# Patient Record
Sex: Female | Born: 1985 | Race: White | Hispanic: No | Marital: Single | Smoking: Never smoker
Health system: Southern US, Community
[De-identification: ages and names within clinical notes are randomized; demographics above are authoritative.]

## PROBLEM LIST (undated history)

## (undated) DIAGNOSIS — G43909 Migraine, unspecified, not intractable, without status migrainosus: Secondary | ICD-10-CM

## (undated) DIAGNOSIS — I10 Essential (primary) hypertension: Secondary | ICD-10-CM

## (undated) DIAGNOSIS — N200 Calculus of kidney: Secondary | ICD-10-CM

## (undated) DIAGNOSIS — F419 Anxiety disorder, unspecified: Secondary | ICD-10-CM

## (undated) HISTORY — PX: ORIF TIBIA & FIBULA FRACTURES: SHX2131

---

## 2021-01-06 ENCOUNTER — Other Ambulatory Visit: Payer: Self-pay | Admitting: Orthopedic Surgery

## 2021-01-06 DIAGNOSIS — M4712 Other spondylosis with myelopathy, cervical region: Secondary | ICD-10-CM

## 2021-01-21 ENCOUNTER — Other Ambulatory Visit: Payer: Self-pay

## 2021-01-21 ENCOUNTER — Ambulatory Visit
Admission: RE | Admit: 2021-01-21 | Discharge: 2021-01-21 | Disposition: A | Discharge status: Home / Self Care | Payer: BC Managed Care – PPO | Source: Ambulatory Visit | Attending: Orthopedic Surgery | Admitting: Orthopedic Surgery

## 2021-01-21 DIAGNOSIS — M4712 Other spondylosis with myelopathy, cervical region: Secondary | ICD-10-CM

## 2021-01-21 IMAGING — MR MR CERVICAL SPINE W/O CM
4 of 5 series · 28 of 48 positions shown · non-contrast
Comparison: None.

CLINICAL DATA: Chronic neck and right shoulder pain with right arm
weakness. No prior surgery.

EXAM:
MRI CERVICAL SPINE WITHOUT CONTRAST
TECHNIQUE: Multiplanar, multisequence MR imaging of the cervical spine was
performed. No intravenous contrast was administered.

[Series 3: T2 · sagittal · 3.0mm · 0.66mm/px · 6 of 15 slices shown (1 of 2)]
[im 1/15]
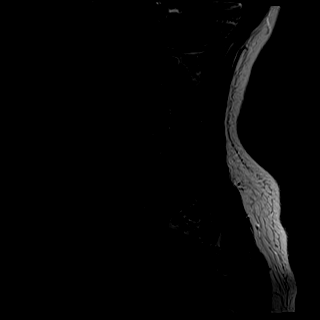
[im 3/15]
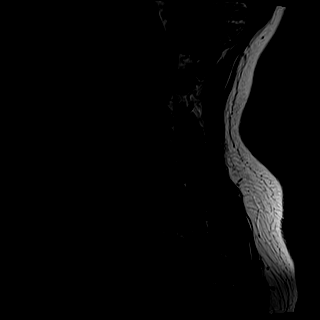
[im 6/15]
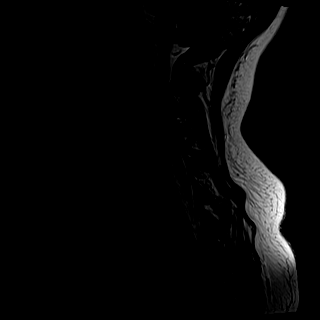
[im 9/15]
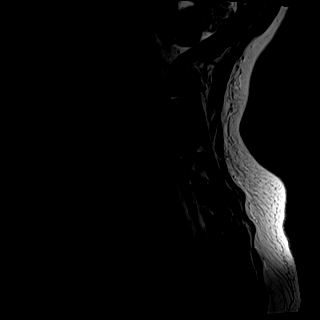
[im 12/15]
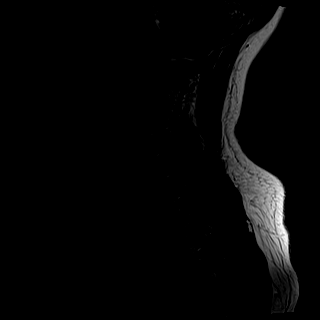
[im 15/15]
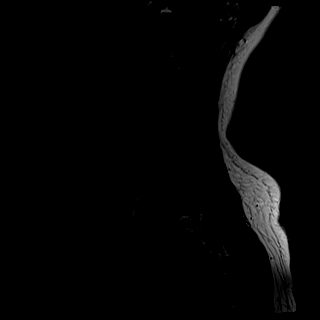

[Series 4: STIR · sagittal · 3.0mm · 0.41mm/px · 7 of 15 slices shown]
[im 1/15]
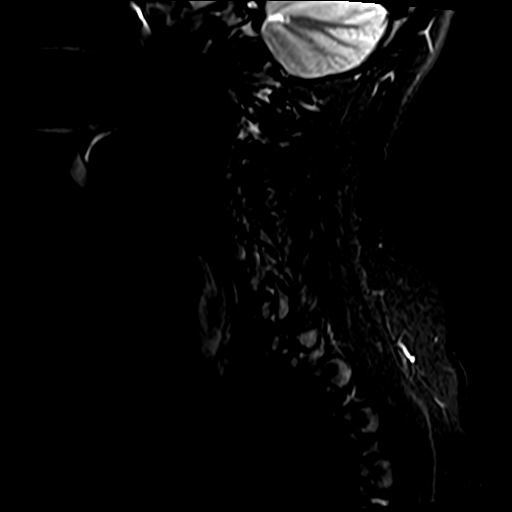
[im 3/15]
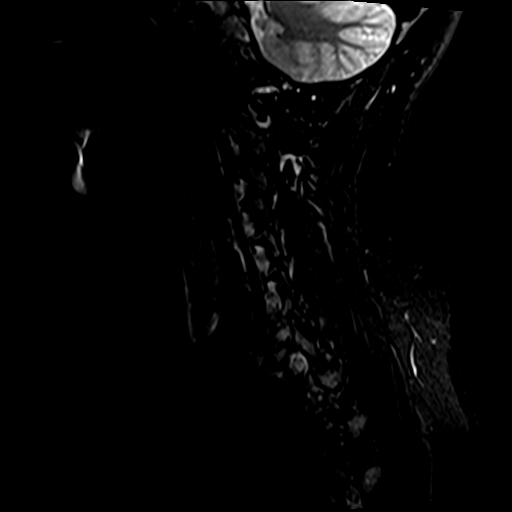
[im 5/15]
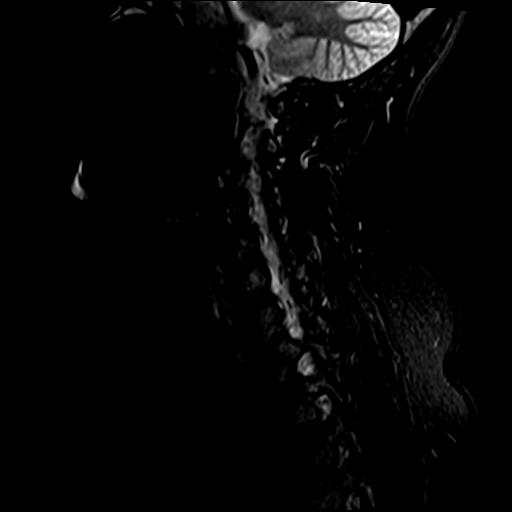
[im 8/15]
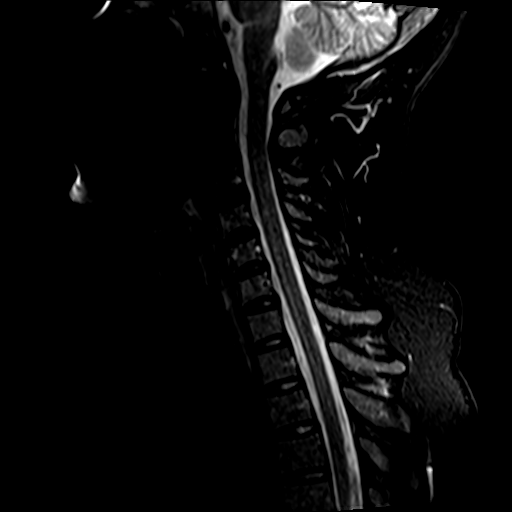
[im 10/15]
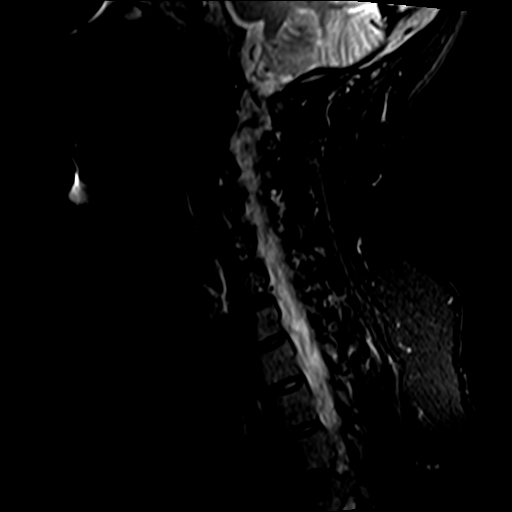
[im 12/15]
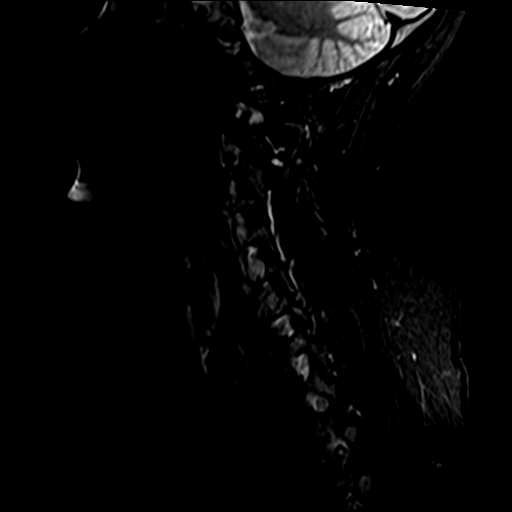
[im 15/15]
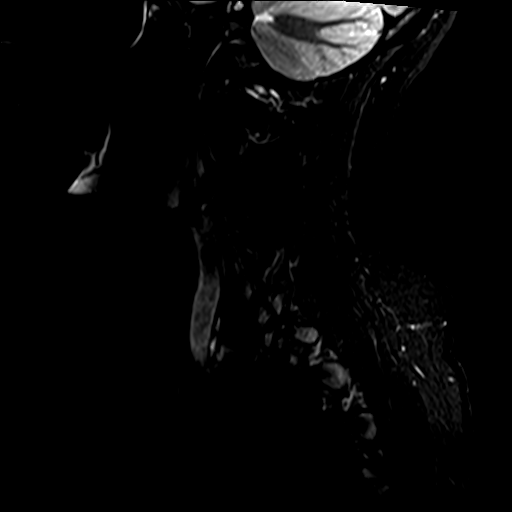

[Series 5: T1 · sagittal · 3.0mm · 0.41mm/px · 7 of 15 slices shown]
[im 1/15]
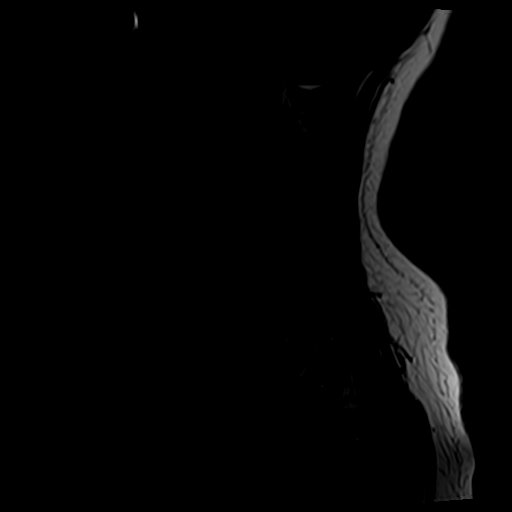
[im 3/15]
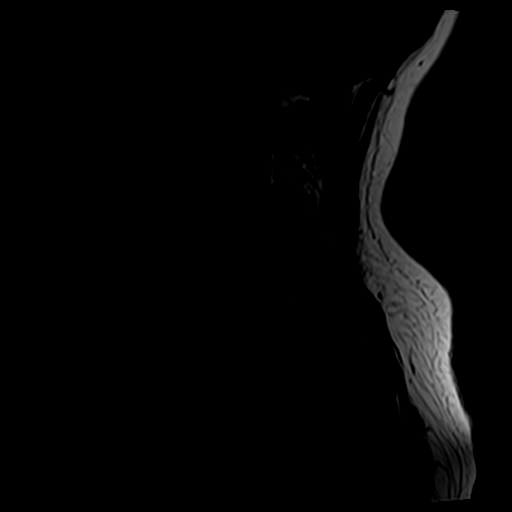
[im 5/15]
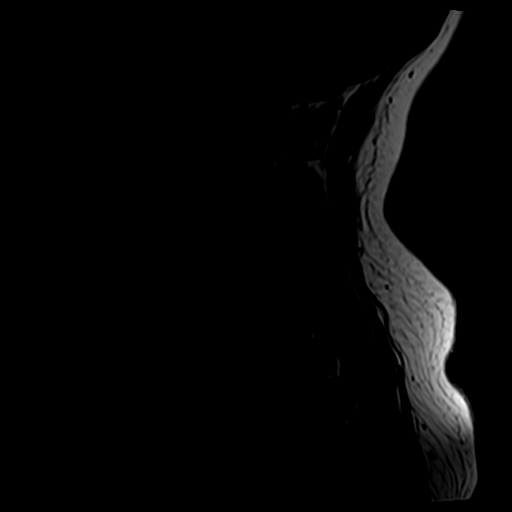
[im 8/15]
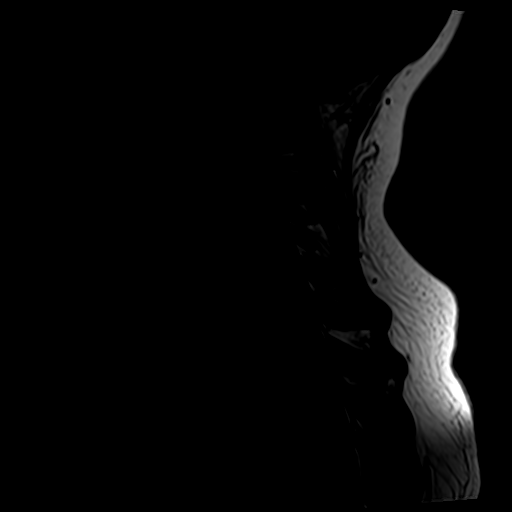
[im 10/15]
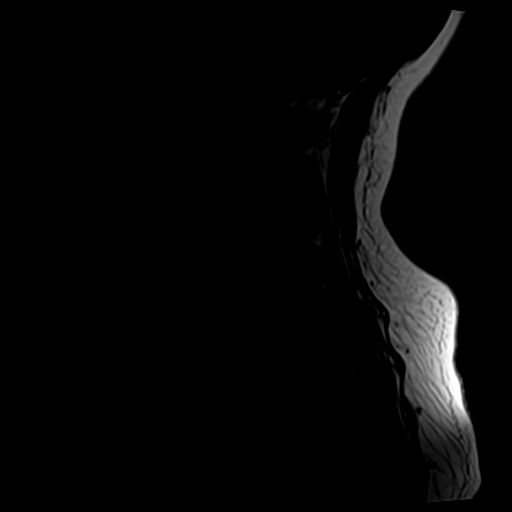
[im 12/15]
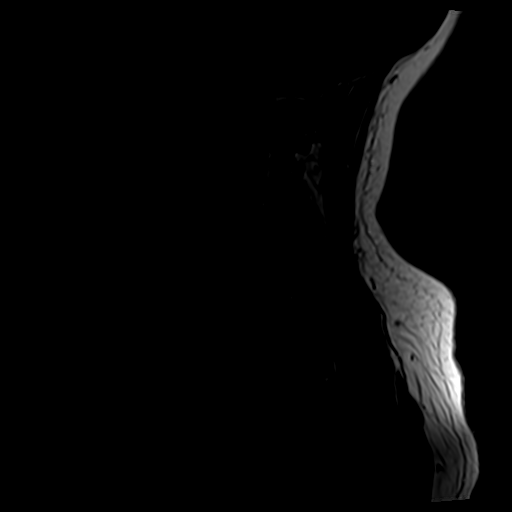
[im 15/15]
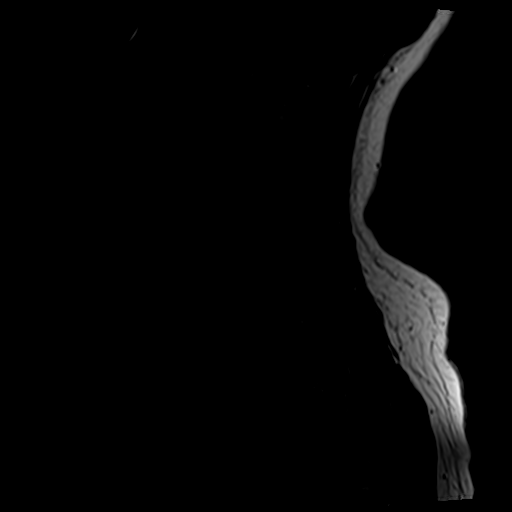

[Series 7: T2 · axial · 3.0mm · 0.70mm/px · z∈[-79,+21]mm · 8 of 30 slices shown (2 of 2)]
[im 1/30]
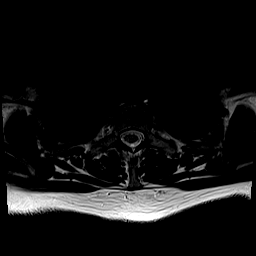
[im 5/30]
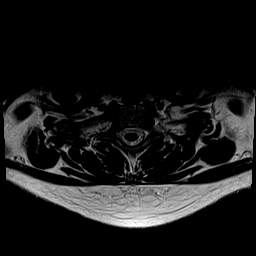
[im 9/30]
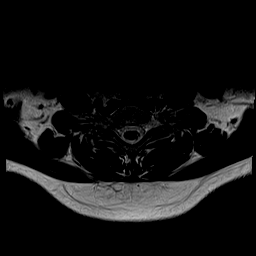
[im 14/30]
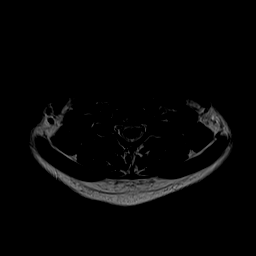
[im 16/30]
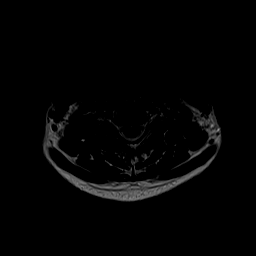
[im 21/30]
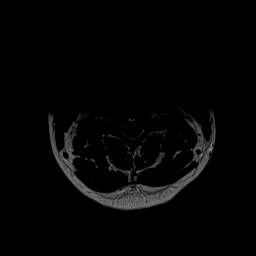
[im 25/30]
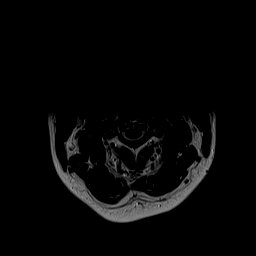
[im 30/30]
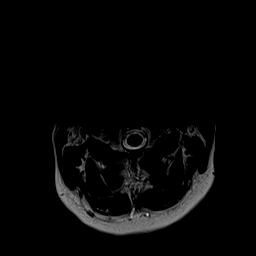

[28 of 48 positions shown; findings below may reference images not displayed]

FINDINGS: Alignment: Straightening of the normal cervical lordosis. No
listhesis.

Vertebrae: No fracture, evidence of discitis, or bone lesion.

Cord: Normal signal and morphology.

Posterior Fossa, vertebral arteries, paraspinal tissues: Negative.
Dominant right vertebral artery.

Disc levels:

Minimal disc bulging from C3-C4 through C6-C7. No stenosis or
impingement at any level.

The visualized upper thoracic spine is unremarkable.
IMPRESSION: 1. Minimal cervical spondylosis without stenosis or impingement at
any level.

## 2022-01-08 ENCOUNTER — Emergency Department (HOSPITAL_BASED_OUTPATIENT_CLINIC_OR_DEPARTMENT_OTHER): Payer: BC Managed Care – PPO

## 2022-01-08 ENCOUNTER — Encounter (HOSPITAL_BASED_OUTPATIENT_CLINIC_OR_DEPARTMENT_OTHER): Payer: Self-pay | Admitting: *Deleted

## 2022-01-08 ENCOUNTER — Other Ambulatory Visit: Payer: Self-pay

## 2022-01-08 ENCOUNTER — Emergency Department (HOSPITAL_BASED_OUTPATIENT_CLINIC_OR_DEPARTMENT_OTHER)
Admission: EM | Admit: 2022-01-08 | Discharge: 2022-01-08 | Disposition: A | Discharge status: Home / Self Care | Payer: BC Managed Care – PPO | Attending: Emergency Medicine | Admitting: Emergency Medicine

## 2022-01-08 DIAGNOSIS — R000 Tachycardia, unspecified: Secondary | ICD-10-CM | POA: Diagnosis not present

## 2022-01-08 DIAGNOSIS — R0789 Other chest pain: Secondary | ICD-10-CM | POA: Diagnosis not present

## 2022-01-08 DIAGNOSIS — R42 Dizziness and giddiness: Secondary | ICD-10-CM | POA: Insufficient documentation

## 2022-01-08 DIAGNOSIS — R0602 Shortness of breath: Secondary | ICD-10-CM | POA: Insufficient documentation

## 2022-01-08 DIAGNOSIS — R002 Palpitations: Secondary | ICD-10-CM | POA: Diagnosis not present

## 2022-01-08 HISTORY — DX: Essential (primary) hypertension: I10

## 2022-01-08 HISTORY — DX: Anxiety disorder, unspecified: F41.9

## 2022-01-08 HISTORY — DX: Migraine, unspecified, not intractable, without status migrainosus: G43.909

## 2022-01-08 HISTORY — DX: Calculus of kidney: N20.0

## 2022-01-08 LAB — CBC WITH DIFFERENTIAL/PLATELET
Abs Immature Granulocytes: 0.01 10*3/uL (ref 0.00–0.07)
Basophils Absolute: 0 10*3/uL (ref 0.0–0.1)
Basophils Relative: 1 %
Eosinophils Absolute: 0 10*3/uL (ref 0.0–0.5)
Eosinophils Relative: 1 %
HCT: 37.3 % (ref 36.0–46.0)
Hemoglobin: 12.7 g/dL (ref 12.0–15.0)
Immature Granulocytes: 0 %
Lymphocytes Relative: 29 %
Lymphs Abs: 1.7 10*3/uL (ref 0.7–4.0)
MCH: 32.9 pg (ref 26.0–34.0)
MCHC: 34 g/dL (ref 30.0–36.0)
MCV: 96.6 fL (ref 80.0–100.0)
Monocytes Absolute: 0.6 10*3/uL (ref 0.1–1.0)
Monocytes Relative: 11 %
Neutro Abs: 3.3 10*3/uL (ref 1.7–7.7)
Neutrophils Relative %: 58 %
Platelets: 274 10*3/uL (ref 150–400)
RBC: 3.86 MIL/uL — ABNORMAL LOW (ref 3.87–5.11)
RDW: 12 % (ref 11.5–15.5)
WBC: 5.6 10*3/uL (ref 4.0–10.5)
nRBC: 0 % (ref 0.0–0.2)

## 2022-01-08 LAB — BASIC METABOLIC PANEL
Anion gap: 7 (ref 5–15)
BUN: 13 mg/dL (ref 6–20)
CO2: 20 mmol/L — ABNORMAL LOW (ref 22–32)
Calcium: 8.6 mg/dL — ABNORMAL LOW (ref 8.9–10.3)
Chloride: 108 mmol/L (ref 98–111)
Creatinine, Ser: 0.93 mg/dL (ref 0.44–1.00)
GFR, Estimated: >60 mL/min (ref >60)
Glucose, Bld: 103 mg/dL — ABNORMAL HIGH (ref 70–99)
Potassium: 3.6 mmol/L (ref 3.5–5.1)
Sodium: 135 mmol/L (ref 135–145)

## 2022-01-08 LAB — RAPID URINE DRUG SCREEN, HOSP PERFORMED
Amphetamines: NOT DETECTED
Barbiturates: NOT DETECTED
Benzodiazepines: NOT DETECTED
Cocaine: NOT DETECTED
Opiates: NOT DETECTED
Tetrahydrocannabinol: POSITIVE — AB

## 2022-01-08 LAB — HCG, SERUM, QUALITATIVE: Preg, Serum: NEGATIVE

## 2022-01-08 LAB — TROPONIN I (HIGH SENSITIVITY): Troponin I (High Sensitivity): 2 ng/L (ref <18)

## 2022-01-08 IMAGING — CR DG CHEST 2V
2 series · 2 of 2 positions shown · non-contrast
Comparison: None.

CLINICAL DATA: Dyspnea

EXAM:
CHEST - 2 VIEW

[w chest pa]
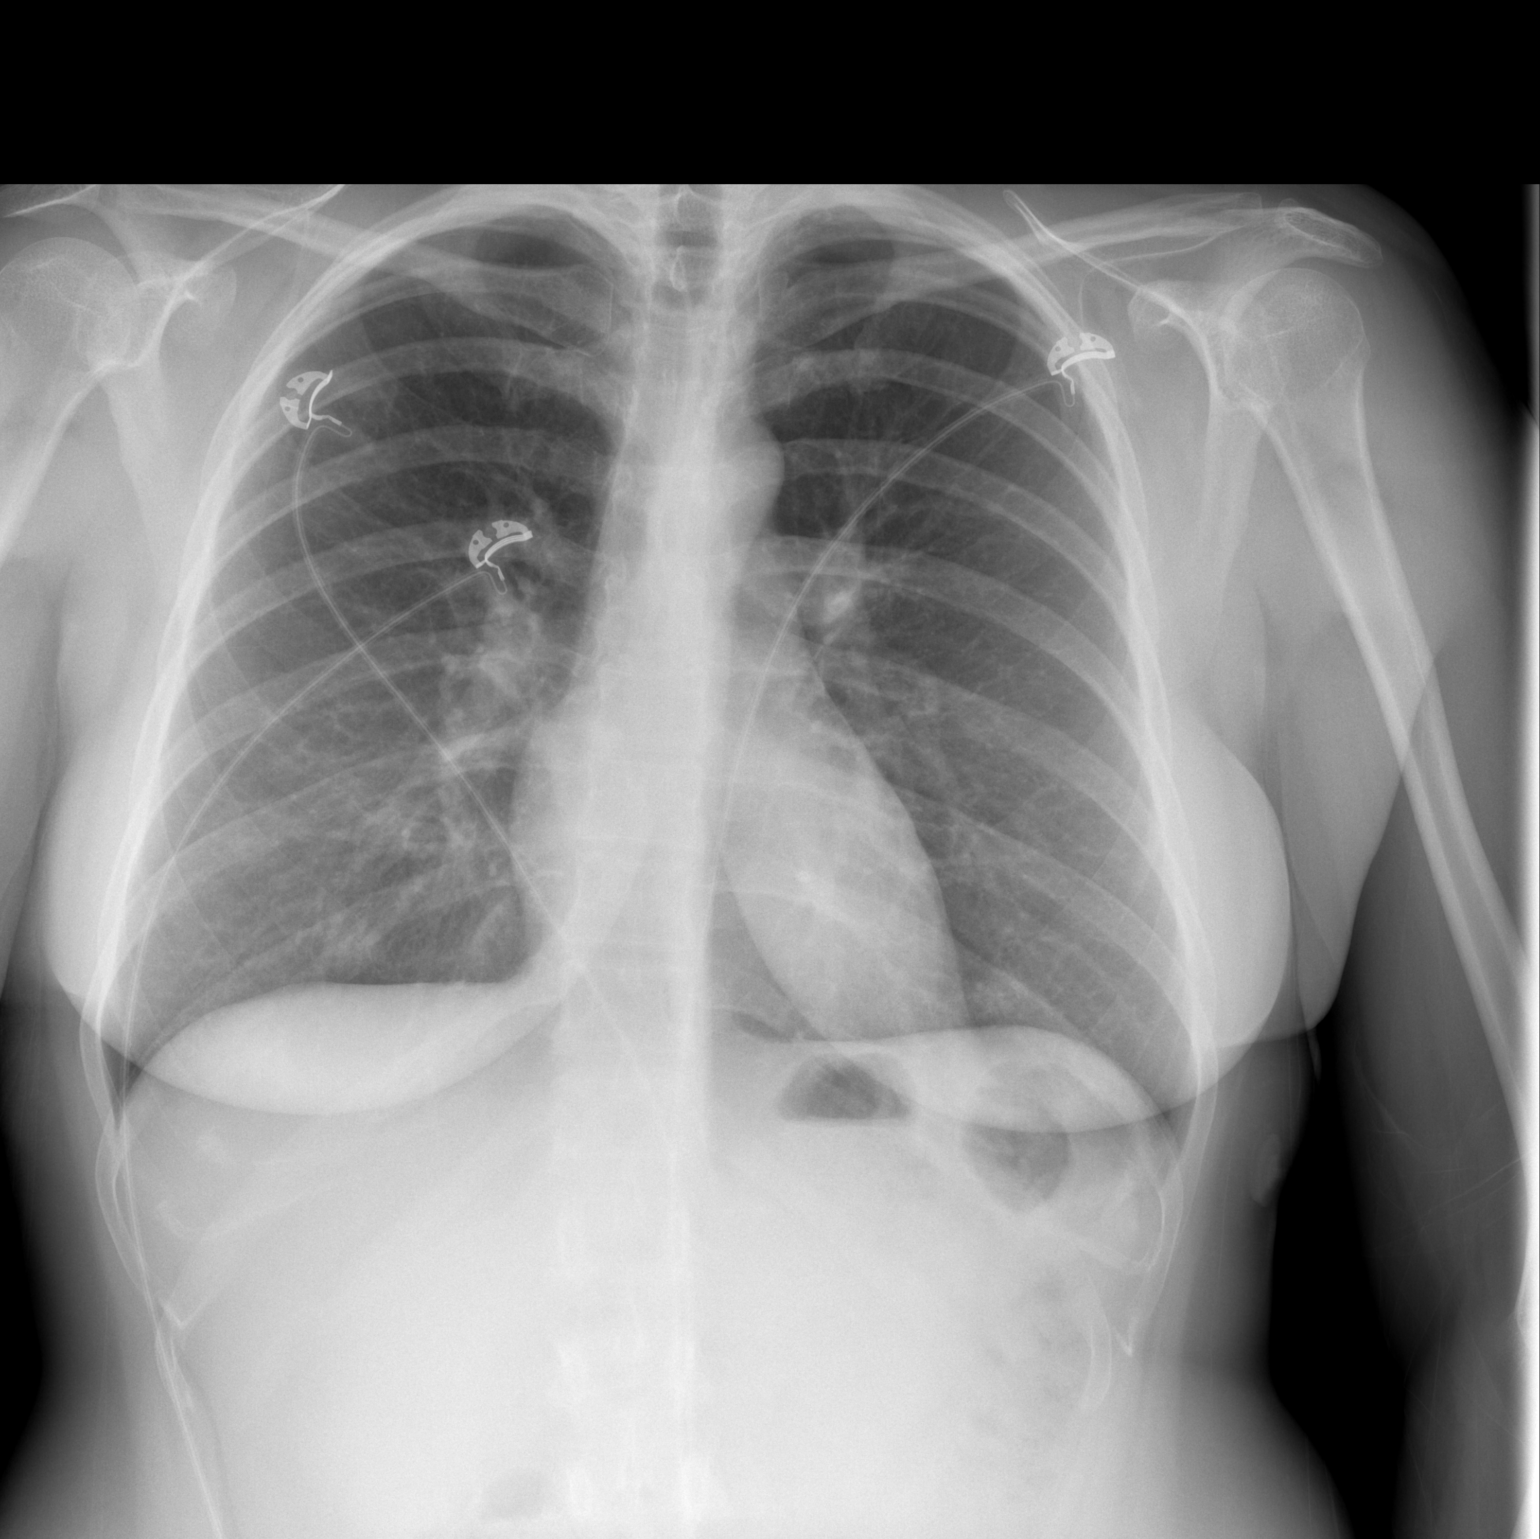

[w chest lat]
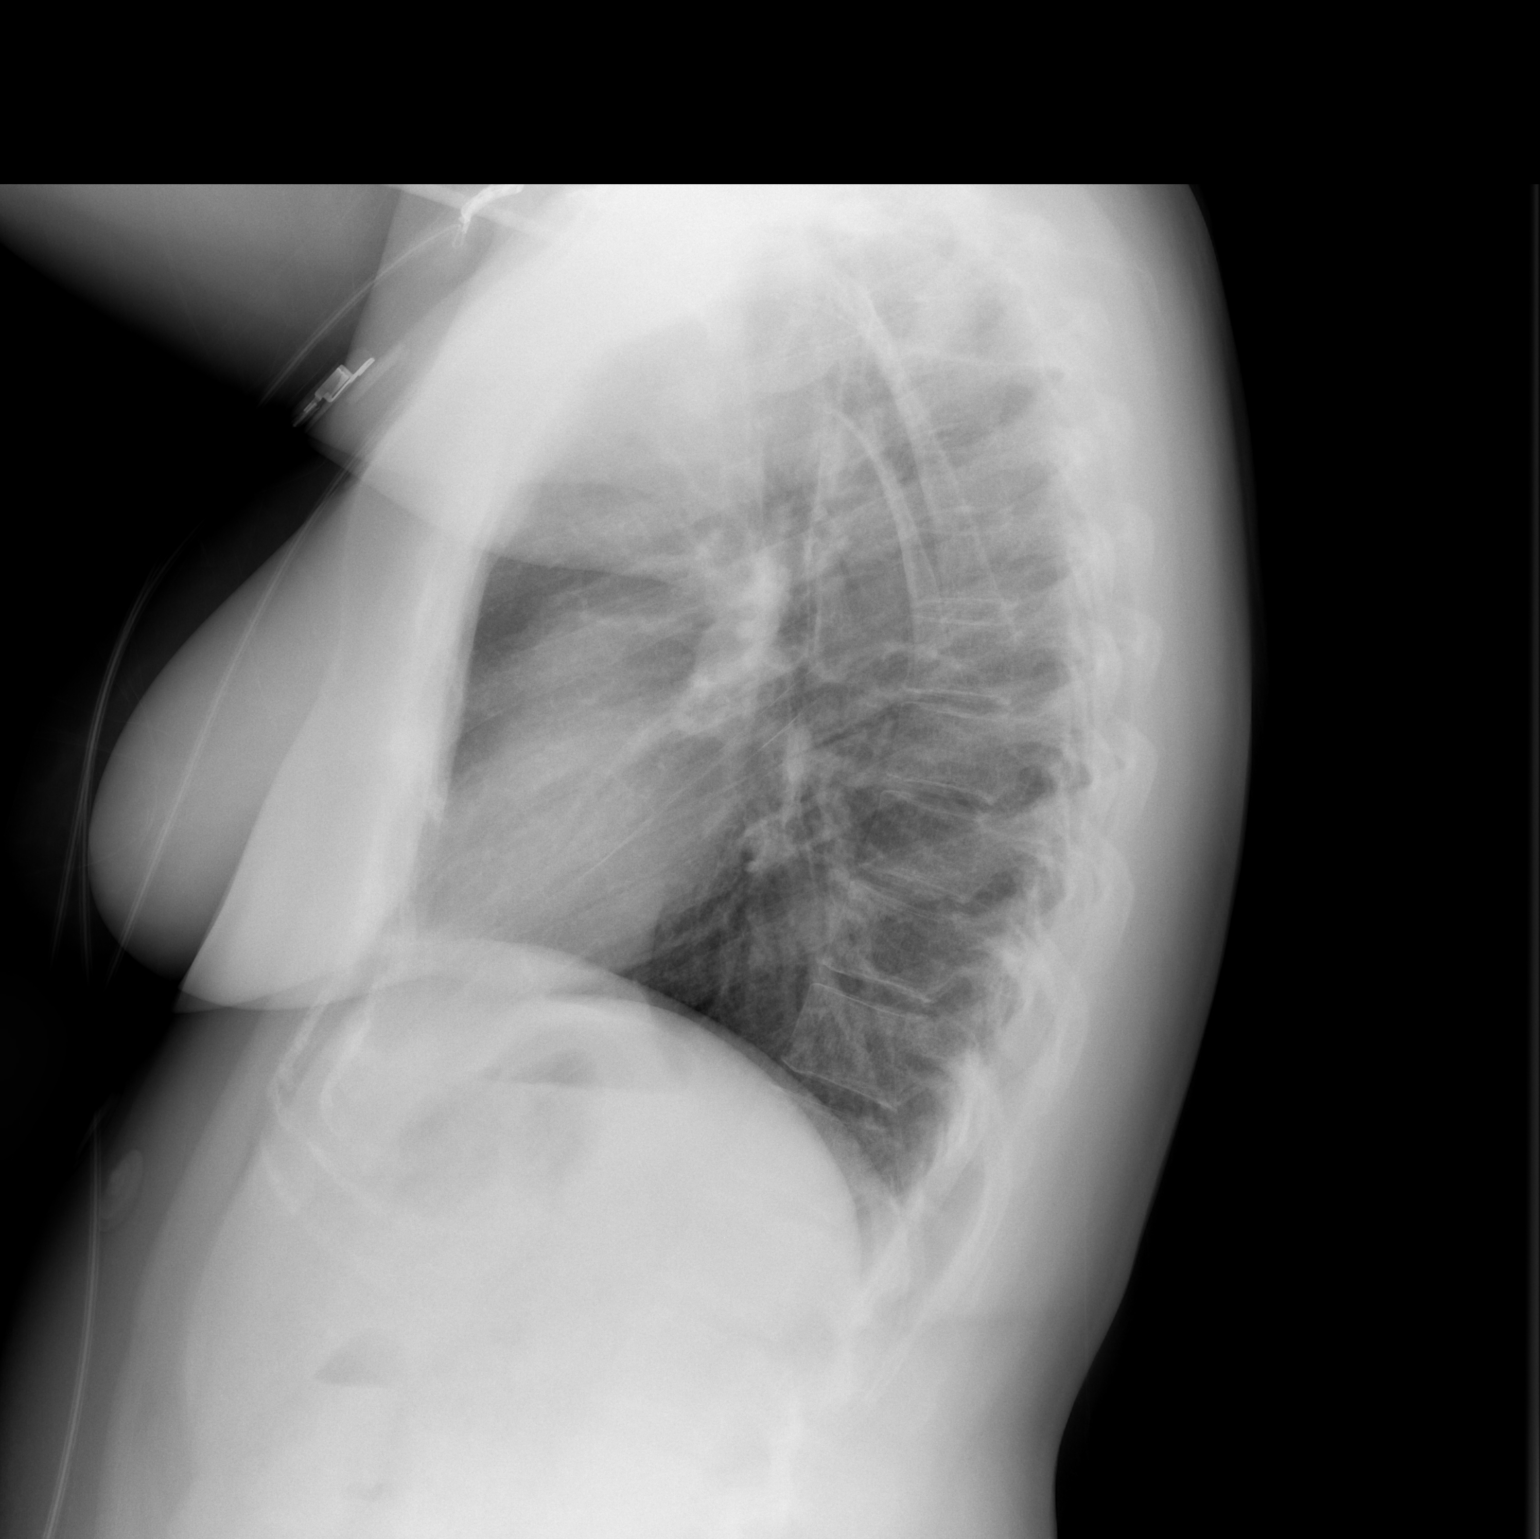

[2 of 2 positions shown; findings below may reference images not displayed]

FINDINGS: The heart size and mediastinal contours are within normal limits.
Both lungs are clear. The visualized skeletal structures are
unremarkable.
IMPRESSION: No active cardiopulmonary disease.

## 2022-01-08 NOTE — ED Provider Notes (Signed)
[facility] EMERGENCY DEPARTMENT Provider Note   CSN: [id number] Arrival date & time: 01/08/22  1815     History  Chief Complaint  Patient presents with   Palpitations    Michele Ray is a 36 y.o. female.  With past medical history of hypertension who presents to the emergency department with palpitations.  Patient states that last night she consumed a delta 8 gummy.  She states that she also had 1 whiskey drink with dinner.  She states that she was fine last night however woke up this morning feeling like her heart was "racing and dropping" around 8 AM.  She states that this is persisted throughout the day until presentation here to the emergency department.  She additionally endorses chest pain, lightheadedness, dizziness and chest tightness.  She denies any coughing, nausea, vomiting, diaphoresis.  She denies any lower extremity swelling, recent immobilization or travel.  States a few months ago she ate a delta 8 gummy and had palpitations. States symptoms did not last this long last time.    Palpitations Associated symptoms: shortness of breath   Associated symptoms: no chest pain, no cough, no dizziness, no nausea and no vomiting       Home Medications Prior to Admission medications   Not on File      Allergies    Patient has no known allergies.    Review of Systems   Review of Systems  Constitutional:  Negative for fever.  Respiratory:  Positive for chest tightness and shortness of breath. Negative for cough.   Cardiovascular:  Positive for palpitations. Negative for chest pain and leg swelling.  Gastrointestinal:  Negative for nausea and vomiting.  Neurological:  Positive for light-headedness. Negative for dizziness and syncope.  All other systems reviewed and are negative.  Physical Exam Updated Vital Signs BP 119/85 (BP Location: Right Arm)    Pulse (!) 122    Resp 18    Wt 77.1 kg    LMP  (LMP Unknown)    SpO2 100%  Physical Exam Vitals and  nursing note reviewed.  Constitutional:      General: She is not in acute distress.    Appearance: Normal appearance. She is normal weight. She is not ill-appearing or toxic-appearing.  HENT:     Head: Normocephalic and atraumatic.     Nose: Nose normal.     Mouth/Throat:     Mouth: Mucous membranes are moist.     Pharynx: Oropharynx is clear.  Eyes:     General: No scleral icterus.    Extraocular Movements: Extraocular movements intact.     Pupils: Pupils are equal, round, and reactive to light.  Cardiovascular:     Rate and Rhythm: Regular rhythm. Tachycardia present.     Pulses: Normal pulses.     Heart sounds: Normal heart sounds. No murmur heard. Pulmonary:     Effort: Pulmonary effort is normal. No respiratory distress.     Breath sounds: Normal breath sounds.  Abdominal:     Palpations: Abdomen is soft.  Musculoskeletal:        General: Normal range of motion.     Cervical back: Normal range of motion and neck supple.  Skin:    General: Skin is warm and dry.     Capillary Refill: Capillary refill takes less than 2 seconds.     Findings: No rash.  Neurological:     General: No focal deficit present.     Mental Status: She is alert and  oriented to person, place, and time. Mental status is at baseline.  Psychiatric:        Mood and Affect: Mood normal.        Behavior: Behavior normal.        Thought Content: Thought content normal.        Judgment: Judgment normal.    ED Results / Procedures / Treatments   Labs (all labs ordered are listed, but only abnormal results are displayed) Labs Reviewed  BASIC METABOLIC PANEL - Abnormal; Notable for the following components:      Result Value   CO2 20 (*)    Glucose, Bld 103 (*)    Calcium 8.6 (*)    All other components within normal limits  CBC WITH DIFFERENTIAL/PLATELET - Abnormal; Notable for the following components:   RBC 3.86 (*)    All other components within normal limits  RAPID URINE DRUG SCREEN, HOSP  PERFORMED - Abnormal; Notable for the following components:   Tetrahydrocannabinol POSITIVE (*)    All other components within normal limits  HCG, SERUM, QUALITATIVE  TROPONIN I (HIGH SENSITIVITY)  TROPONIN I (HIGH SENSITIVITY)    EKG EKG Interpretation  Date/Time:  Saturday January 08 2022 18:30:42 EST Ventricular Rate:  119 PR Interval:  122 QRS Duration: 94 QT Interval:  340 QTC Calculation: 478 R Axis:   76 Text Interpretation: Sinus tachycardia Otherwise normal ECG No previous ECGs available Confirmed by Alona Bene [phone]) on 01/08/2022 6:39:45 PM  Radiology DG Chest 2 View  Result Date: 01/08/2022 CLINICAL DATA:  Dyspnea EXAM: CHEST - 2 VIEW COMPARISON:  None. FINDINGS: The heart size and mediastinal contours are within normal limits. Both lungs are clear. The visualized skeletal structures are unremarkable. IMPRESSION: No active cardiopulmonary disease. Electronically Signed   By: Helyn Numbers M.D.   On: 01/08/2022 19:40    Procedures Procedures   Medications Ordered in ED Medications - No data to display  ED Course/ Medical Decision Making/ A&P                           Medical Decision Making Amount and/or Complexity of Data Reviewed Labs: ordered. Radiology: ordered.  Patient presents to the ED with complaints of palpitations. This involves an extensive number of treatment options, and is a complaint that carries with it a high risk of complications and morbidity.   Additional history obtained:  Additional history obtained from: n/a  External records from outside source obtained and reviewed including: n/a  EKG: EKG: sinus tachycardia.   Cardiac Monitoring: The patient was maintained on a cardiac monitor.  I personally viewed and interpreted the cardiac monitored which showed an underlying rhythm of: sinus rhythm to sinus tachycardia  Lab Results: I personally ordered, reviewed, and interpreted labs. Pertinent results include: CBC without anemia  BMP  without electrolyte dysfunction  Negative pregnancy Troponin x1 negative  UDS + THC   Imaging Studies ordered:  I ordered imaging studies which included x-ray.  I independently reviewed & interpreted imaging & am in agreement with radiology impression. Imaging shows: No active cardiopulmonary disease   ED Course: 36 year old female who presents to the emergency department with palpitations.  EKG without signs of ischemia or infarction, troponin x1 negative.  Doubt ACS at this time. CBC without anemia to contribute to tachycardia Not pregnant No electrolyte dysfunctions Chest x-ray without evidence of pneumothorax, pneumonia Given that she is not hypertensive, febrile with her doctors already I will not pursue  TSH at this time.  Doubt thyroiditis. No coingestions with THC. EKG with sinus tachycardia without tachyarrhythmia requiring further intervention. Doubt other etiologies of her symptoms such as tamponade, dissection, metabolic derangements. Work-up reassuring.  Unclear etiology of her palpitations although I do think ingestion of THC last night is the likely culprit at this time.  She had similar symptoms with previous ingestion of THC however just not as severe.  Doubt cardiopulmonary process of her symptoms.  Discussed these findings with her at bedside.  D[facility]ed [facility], drug use, alcohol use until symptoms resolve.  Discussed drinking lots of fluids as well as rest.  She verbalized understanding.  I have answered all her questions at this time.  She is otherwise hemodynamically stable.  After consideration of the diagnostic results and the patients response to treatment, I feel that the patent would benefit from discharge. The patient has been appropriately medically screened and/or stabilized in the ED. I have low suspicion for any other emergent medical condition which would require further screening, evaluation or treatment in the ED or require inpatient management.  The patient is overall well appearing and non-toxic in appearance. They are hemodynamically stable at time of discharge.   Final Clinical Impression(s) / ED Diagnoses Final diagnoses:  Palpitations    Rx / DC Orders ED Discharge Orders     None         Cristopher Peruutry, Norita Meigs E, PA-C 01/08/22 2141    Maia PlanLong, Joshua G, MD 01/18/22 [phone]

## 2022-01-08 NOTE — ED Triage Notes (Signed)
Pt states that she has had heart palpitations today as high as 150 (based on apple watch).  Pt reports that she took a delta 8 gummy last pm she is unsure how many mg but thinks it was 60mg .  Pt reports that she once before had heart racing after taking a thc gummy but when the gummy had worn off the palpitations were gone.  Pt is concerned that she continues to have palpitations almost 24 hours after taking the gummy.

## 2022-01-08 NOTE — Discharge Instructions (Addendum)
You were seen in the emergency department to day for palpitations. Your workup was very reassuring. I think your palpitations are likely related to the [facility] use last night. Please abstain from [facility] as well as alcohol until your symptoms resolve. Continue to drink lots of water. Please return for worsening symptoms.

## 2022-01-08 NOTE — ED Notes (Signed)
Pt and sister had questions about discharge and were concerned.  Explained the significance of her troponin which was not elevated and EKG and we discussed her anxiety and the benefit to taking her prn home medication as well as returning here with worsening symptoms

## 2022-02-09 ENCOUNTER — Telehealth: Payer: Self-pay

## 2022-02-09 NOTE — Telephone Encounter (Signed)
NOTES SCANNED TO REFERRAL 

## 2022-03-25 ENCOUNTER — Other Ambulatory Visit: Payer: Self-pay | Admitting: Specialist

## 2022-03-25 DIAGNOSIS — R42 Dizziness and giddiness: Secondary | ICD-10-CM

## 2022-03-25 DIAGNOSIS — H9319 Tinnitus, unspecified ear: Secondary | ICD-10-CM

## 2022-03-31 ENCOUNTER — Ambulatory Visit: Payer: Commercial Managed Care - PPO | Admitting: Cardiology

## 2022-04-14 ENCOUNTER — Ambulatory Visit
Admission: RE | Admit: 2022-04-14 | Discharge: 2022-04-14 | Disposition: A | Discharge status: Home / Self Care | Payer: BC Managed Care – PPO | Source: Ambulatory Visit | Attending: Specialist | Admitting: Specialist

## 2022-04-14 DIAGNOSIS — R42 Dizziness and giddiness: Secondary | ICD-10-CM

## 2022-04-14 DIAGNOSIS — H9319 Tinnitus, unspecified ear: Secondary | ICD-10-CM

## 2022-04-14 IMAGING — MR MR HEAD WO/W CM
10 of 12 series · 31 of 48 positions shown · IV contrast (multihance)
Comparison: None Available.

CLINICAL DATA: Dizziness, tinnitus, unspecified laterality.
Migraines and confusion.

EXAM:
MRI HEAD WITHOUT AND WITH CONTRAST
TECHNIQUE: Multiplanar, multiecho pulse sequences of the brain and surrounding
structures were obtained without and with intravenous contrast.
CONTRAST:  15mL MULTIHANCE GADOBENATE DIMEGLUMINE 529 MG/ML IV SOLN

[Series 2: T1 · sagittal · 5.0mm · 0.45mm/px · 3 of 21 slices shown (1 of 2)]
[im 1/21]
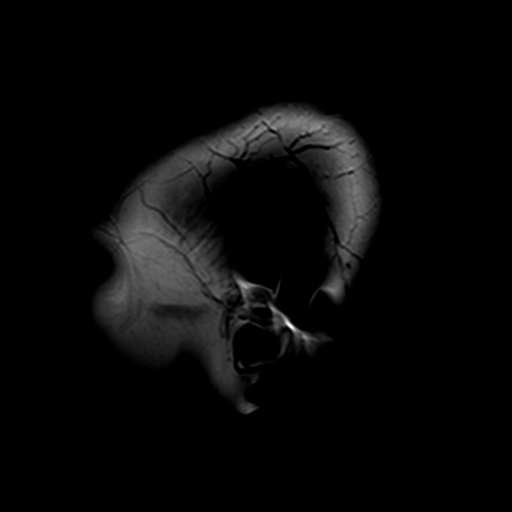
[im 11/21]
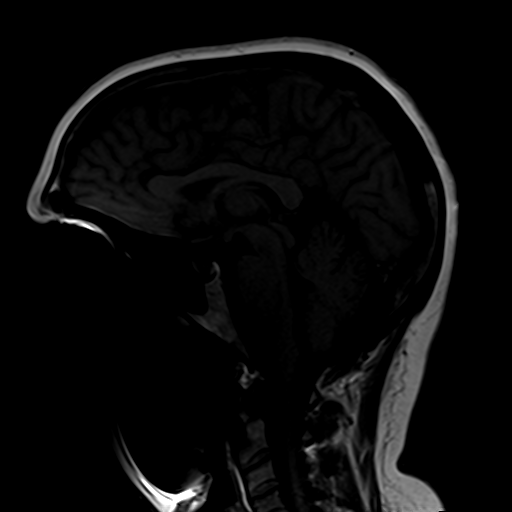
[im 21/21]
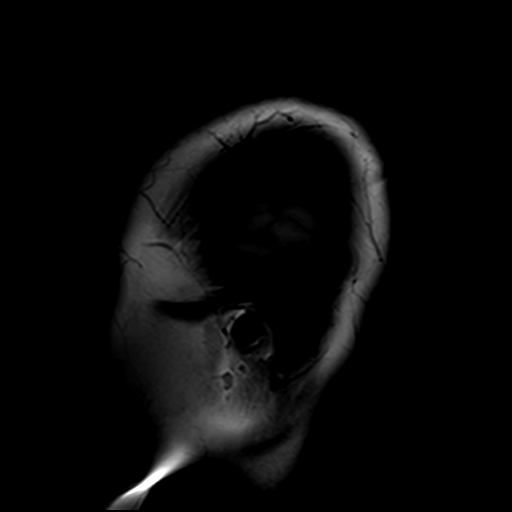

[Series 3: DWI · axial · 5.0mm · 1.80mm/px · z∈[-39,+117]mm · 6 of 49 slices shown (1 of 2)]
[im 1/49]
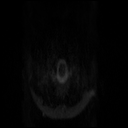
[im 10/49]
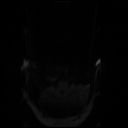
[im 20/49]
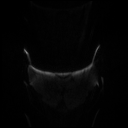
[im 29/49]
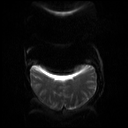
[im 39/49]
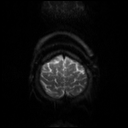
[im 49/49]
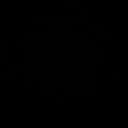

[Series 4: DWI · axial · 5.0mm · 1.80mm/px · z∈[-39,+111]mm · 3 of 24 slices shown (2 of 2)]
[im 1/24]
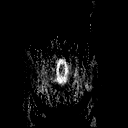
[im 12/24]
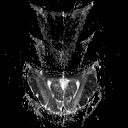
[im 24/24]
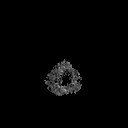

[Series 5: T2 · axial · 5.0mm · 0.51mm/px · z∈[-23,+107]mm · 2 of 21 slices shown (1 of 2)]
[im 1/21]
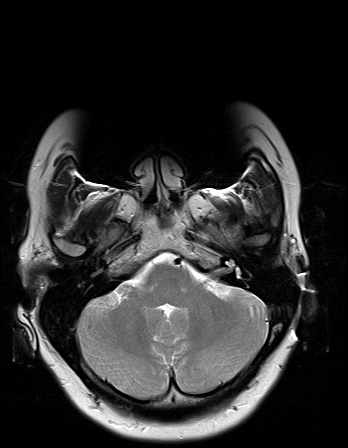
[im 21/21]
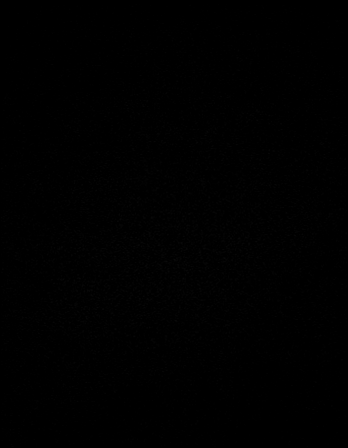

[Series 6: axial grad (blood) · axial · 5.0mm · 0.45mm/px · z∈[-46,+104]mm · 3 of 24 slices shown]
[im 1/24]
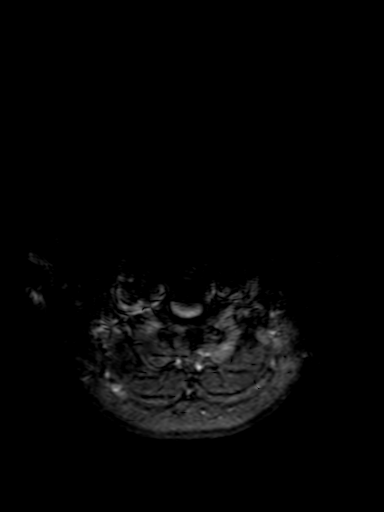
[im 12/24]
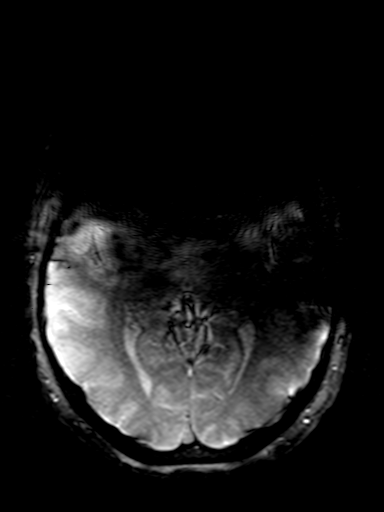
[im 24/24]
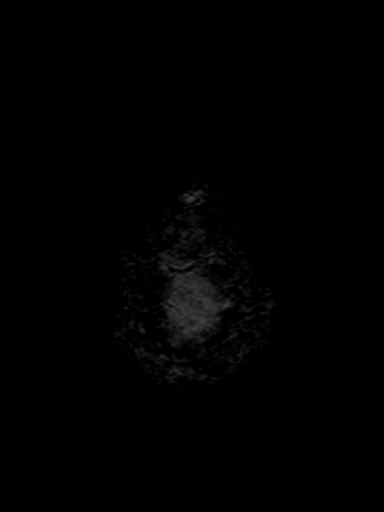

[Series 7: FLAIR · axial · 5.0mm · 0.45mm/px · z∈[-23,+107]mm · 2 of 21 slices shown]
[im 1/21]
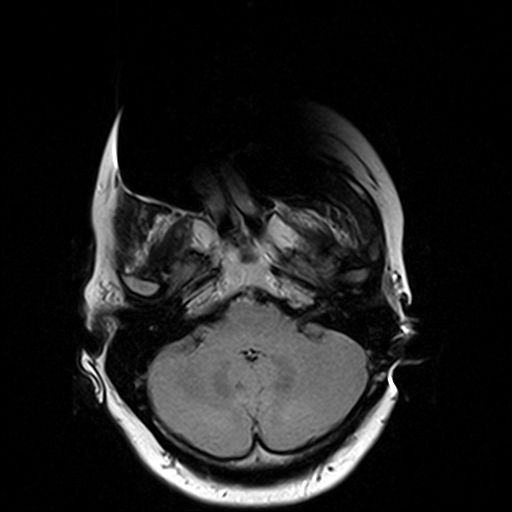
[im 21/21]
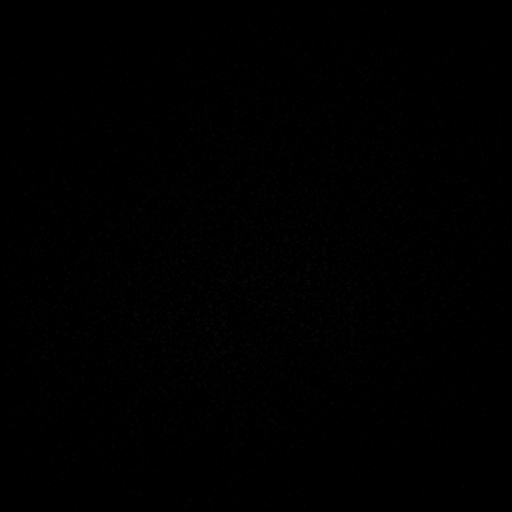

[Series 9: T2 · coronal · 5.0mm · 0.45mm/px · 3 of 26 slices shown (2 of 2)]
[im 1/26]
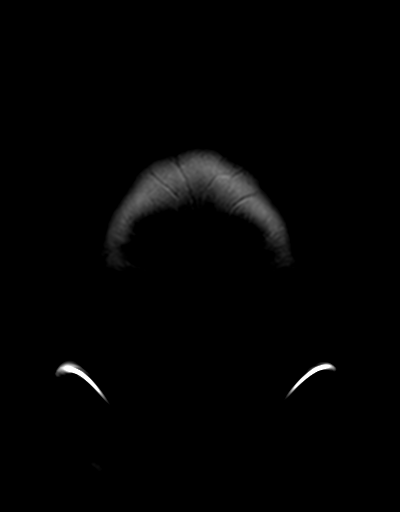
[im 13/26]
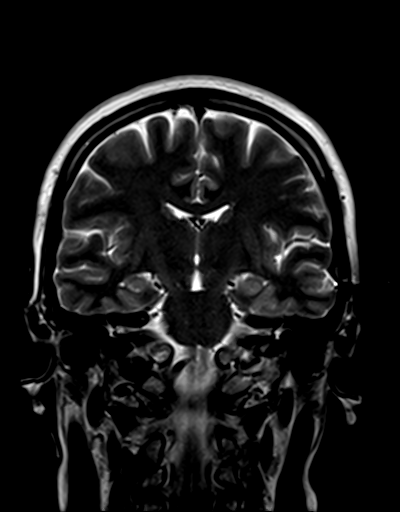
[im 26/26]
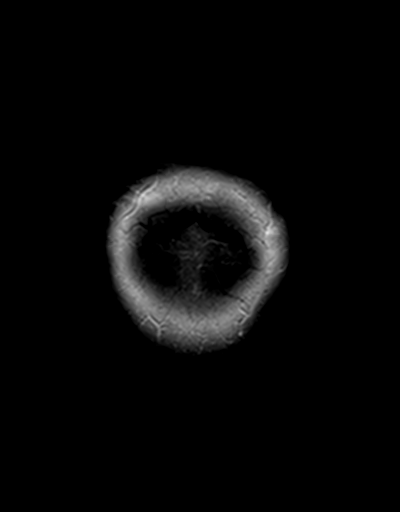

[Series 10: T1 · axial · 5.0mm · 0.45mm/px · z∈[-50,+99]mm · 3 of 24 slices shown (2 of 2)]
[im 1/24]
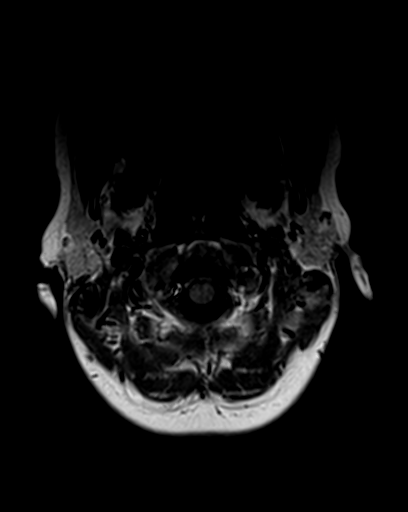
[im 12/24]
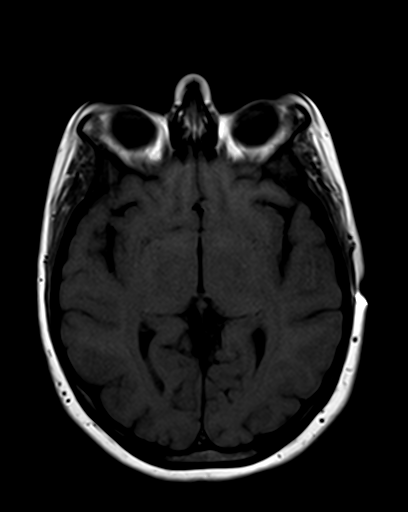
[im 24/24]
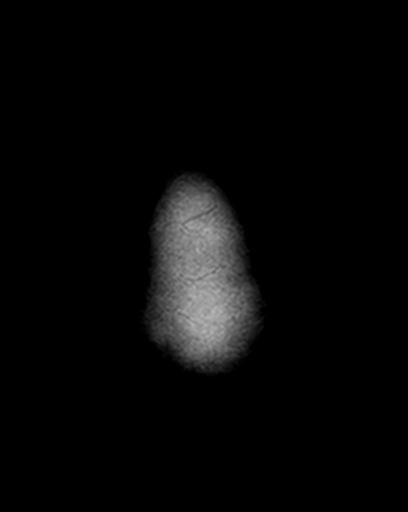

[Series 12: T1 post-contrast · axial · 5.0mm · 0.45mm/px · z∈[-54,+102]mm · 3 of 25 slices shown (1 of 2)]
[im 1/25]
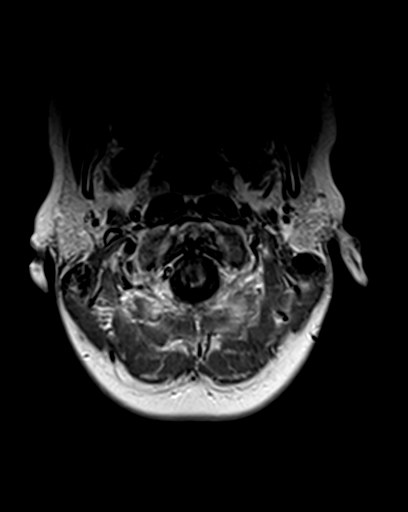
[im 13/25]
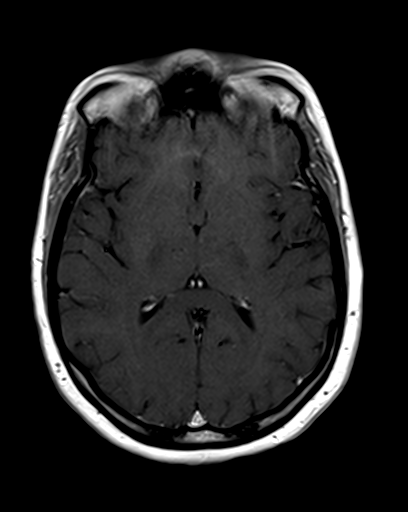
[im 25/25]
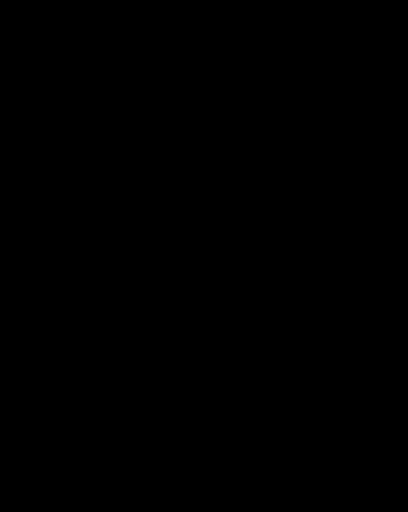

[Series 13: T1 post-contrast · coronal · 5.0mm · 0.45mm/px · 3 of 26 slices shown (2 of 2)]
[im 1/26]
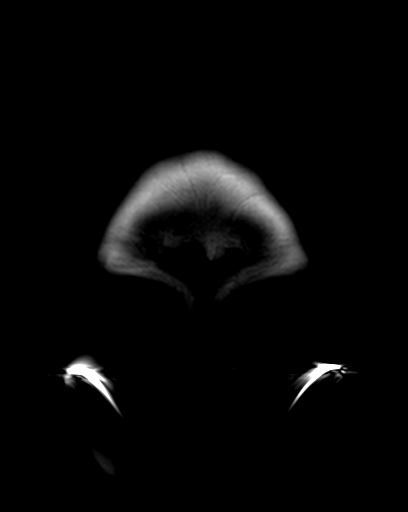
[im 13/26]
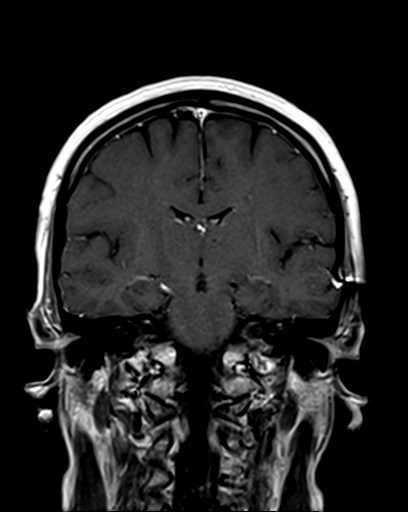
[im 26/26]
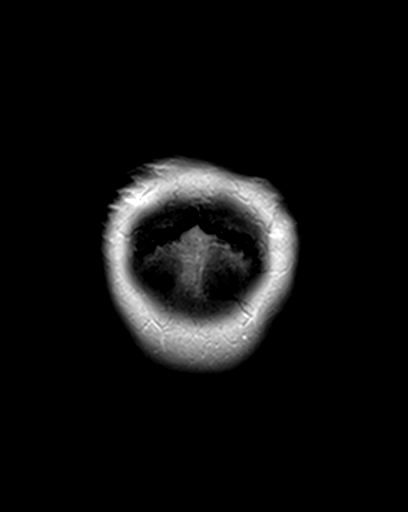

[31 of 48 positions shown; findings below may reference images not displayed]

FINDINGS: Severe susceptibility artifacts due to braces.

Brain: No acute infarct, mass effect or extra-axial collection. No
acute or chronic hemorrhage. Normal white matter signal, parenchymal
volume and CSF spaces. The midline structures are normal.

Vascular: Major flow voids are preserved.

Skull and upper cervical spine: Normal calvarium and skull base.
Visualized upper cervical spine and soft tissues are normal.

Sinuses/Orbits:No paranasal sinus fluid levels or advanced mucosal
thickening. No mastoid or middle ear effusion. Normal orbits.
IMPRESSION: Severe susceptibility artifacts due to braces. Within that
limitation, normal MRI of the brain.

## 2022-04-14 MED ORDER — GADOBENATE DIMEGLUMINE 529 MG/ML IV SOLN
15.0000 mL | Freq: Once | INTRAVENOUS | Status: AC | PRN
  Administered 2022-04-14: 15 mL via INTRAVENOUS
